# Patient Record
Sex: Male | Born: 1965 | Race: Black or African American | Hispanic: No | Marital: Married | Smoking: Never smoker
Health system: Southern US, Community
[De-identification: ages and names within clinical notes are randomized; demographics above are authoritative.]

---

## 2013-04-18 ENCOUNTER — Ambulatory Visit
Admission: RE | Admit: 2013-04-18 | Discharge: 2013-04-18 | Disposition: A | Discharge status: Home / Self Care | Payer: Federal, State, Local not specified - PPO | Source: Ambulatory Visit | Attending: Family Medicine | Admitting: Family Medicine

## 2013-04-18 ENCOUNTER — Other Ambulatory Visit: Payer: Self-pay | Admitting: Family Medicine

## 2013-04-18 DIAGNOSIS — M7502 Adhesive capsulitis of left shoulder: Secondary | ICD-10-CM

## 2015-10-18 DIAGNOSIS — J209 Acute bronchitis, unspecified: Secondary | ICD-10-CM | POA: Diagnosis not present

## 2016-07-22 DIAGNOSIS — L918 Other hypertrophic disorders of the skin: Secondary | ICD-10-CM | POA: Diagnosis not present

## 2017-05-26 DIAGNOSIS — Z0000 Encounter for general adult medical examination without abnormal findings: Secondary | ICD-10-CM | POA: Diagnosis not present

## 2017-05-26 DIAGNOSIS — Z8042 Family history of malignant neoplasm of prostate: Secondary | ICD-10-CM | POA: Diagnosis not present

## 2017-05-26 DIAGNOSIS — E7800 Pure hypercholesterolemia, unspecified: Secondary | ICD-10-CM | POA: Diagnosis not present

## 2017-08-16 ENCOUNTER — Other Ambulatory Visit: Payer: Self-pay

## 2017-08-16 DIAGNOSIS — R519 Headache, unspecified: Secondary | ICD-10-CM

## 2017-08-16 DIAGNOSIS — R51 Headache: Secondary | ICD-10-CM

## 2017-08-16 DIAGNOSIS — Z8249 Family history of ischemic heart disease and other diseases of the circulatory system: Secondary | ICD-10-CM

## 2017-08-22 DIAGNOSIS — R0789 Other chest pain: Secondary | ICD-10-CM | POA: Diagnosis not present

## 2017-08-22 DIAGNOSIS — L309 Dermatitis, unspecified: Secondary | ICD-10-CM | POA: Diagnosis not present

## 2017-08-25 DIAGNOSIS — B029 Zoster without complications: Secondary | ICD-10-CM | POA: Diagnosis not present

## 2017-09-01 ENCOUNTER — Ambulatory Visit
Admission: RE | Admit: 2017-09-01 | Discharge: 2017-09-01 | Disposition: A | Discharge status: Home / Self Care | Payer: Federal, State, Local not specified - PPO | Source: Ambulatory Visit | Attending: Family Medicine | Admitting: Family Medicine

## 2017-09-01 DIAGNOSIS — Z8249 Family history of ischemic heart disease and other diseases of the circulatory system: Secondary | ICD-10-CM | POA: Diagnosis not present

## 2017-09-01 DIAGNOSIS — R51 Headache: Principal | ICD-10-CM

## 2017-09-01 DIAGNOSIS — R519 Headache, unspecified: Secondary | ICD-10-CM

## 2017-09-01 MED ORDER — GADOBENATE DIMEGLUMINE 529 MG/ML IV SOLN
20.0000 mL | Freq: Once | INTRAVENOUS | Status: AC | PRN
  Administered 2017-09-01: 20 mL via INTRAVENOUS

## 2017-09-01 MED ORDER — IOPAMIDOL (ISOVUE-370) INJECTION 76%
75.0000 mL | Freq: Once | INTRAVENOUS | Status: AC | PRN
  Administered 2017-09-01: 75 mL via INTRAVENOUS

## 2017-11-11 DIAGNOSIS — T63481A Toxic effect of venom of other arthropod, accidental (unintentional), initial encounter: Secondary | ICD-10-CM | POA: Diagnosis not present

## 2018-02-09 DIAGNOSIS — Z0184 Encounter for antibody response examination: Secondary | ICD-10-CM | POA: Diagnosis not present

## 2018-09-03 ENCOUNTER — Other Ambulatory Visit: Payer: Self-pay | Admitting: Family Medicine

## 2018-09-03 DIAGNOSIS — I7779 Dissection of other artery: Secondary | ICD-10-CM

## 2018-09-20 ENCOUNTER — Other Ambulatory Visit: Payer: Federal, State, Local not specified - PPO

## 2018-11-12 ENCOUNTER — Other Ambulatory Visit: Payer: Self-pay

## 2018-11-12 ENCOUNTER — Ambulatory Visit
Admission: RE | Admit: 2018-11-12 | Discharge: 2018-11-12 | Disposition: A | Discharge status: Home / Self Care | Payer: Federal, State, Local not specified - PPO | Source: Ambulatory Visit | Attending: Family Medicine | Admitting: Family Medicine

## 2018-11-12 DIAGNOSIS — I7779 Dissection of other artery: Secondary | ICD-10-CM | POA: Diagnosis not present

## 2018-11-12 MED ORDER — IOPAMIDOL (ISOVUE-370) INJECTION 76%
100.0000 mL | Freq: Once | INTRAVENOUS | Status: AC | PRN
  Administered 2018-11-12: 100 mL via INTRAVENOUS

## 2018-12-03 DIAGNOSIS — Z20828 Contact with and (suspected) exposure to other viral communicable diseases: Secondary | ICD-10-CM | POA: Diagnosis not present

## 2019-03-01 DIAGNOSIS — Z8042 Family history of malignant neoplasm of prostate: Secondary | ICD-10-CM | POA: Diagnosis not present

## 2019-03-01 DIAGNOSIS — Z23 Encounter for immunization: Secondary | ICD-10-CM | POA: Diagnosis not present

## 2019-03-01 DIAGNOSIS — E7800 Pure hypercholesterolemia, unspecified: Secondary | ICD-10-CM | POA: Diagnosis not present

## 2019-03-01 DIAGNOSIS — Z0000 Encounter for general adult medical examination without abnormal findings: Secondary | ICD-10-CM | POA: Diagnosis not present

## 2019-03-05 DIAGNOSIS — Z1211 Encounter for screening for malignant neoplasm of colon: Secondary | ICD-10-CM | POA: Diagnosis not present

## 2019-08-27 IMAGING — CT CT ANGIOGRAPHY ABDOMEN AND PELVIS WITH CONTRAST AND WITHOUT CONT
2 of 4 series · 12 of 32 positions shown, 17 images · IV contrast (APPLIED)
Comparison: CT chest 09/01/2017

CLINICAL DATA: 52-year-old male with a history of prior celiac
artery findings

EXAM:
CTA ABDOMEN AND PELVIS WITH CONTRAST
TECHNIQUE: Multidetector CT imaging of the abdomen and pelvis was performed
using the standard protocol during bolus administration of
intravenous contrast. Multiplanar reconstructed images and MIPs were
obtained and reviewed to evaluate the vascular anatomy.
CONTRAST:  100mL [id number]-YYN IOPAMIDOL (MQ3FES-YYN) INJECTION 76%

[Series 4: mesenteric angio · axial · 0.73mm/px · z∈[+852,+1101]mm · 5 of 167 slices shown]
[im 12/167  soft-tissue]
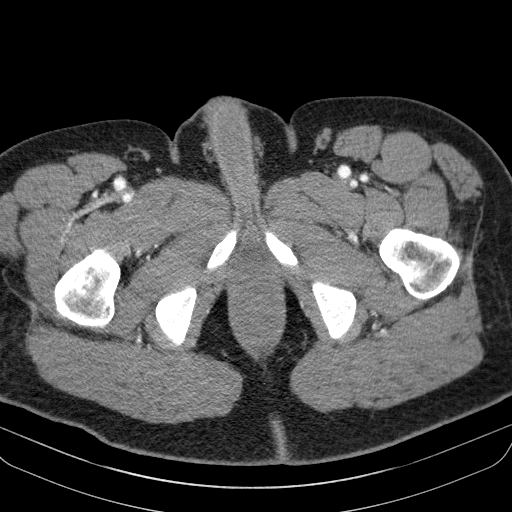
[im 36/167  soft-tissue]
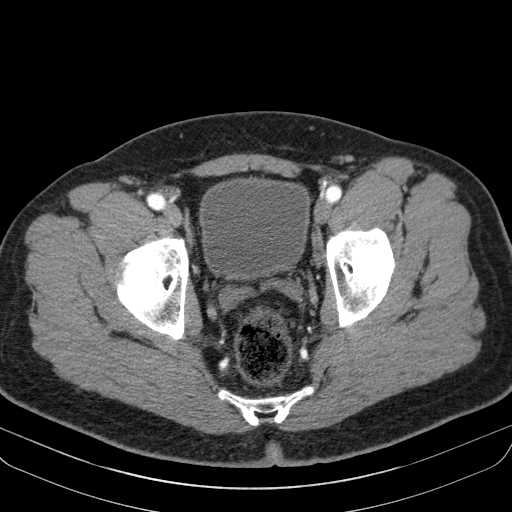
[im 60/167  soft-tissue]
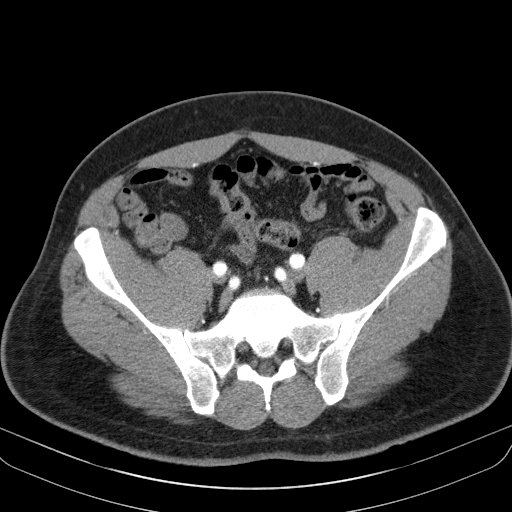
[im 72/167  soft-tissue]
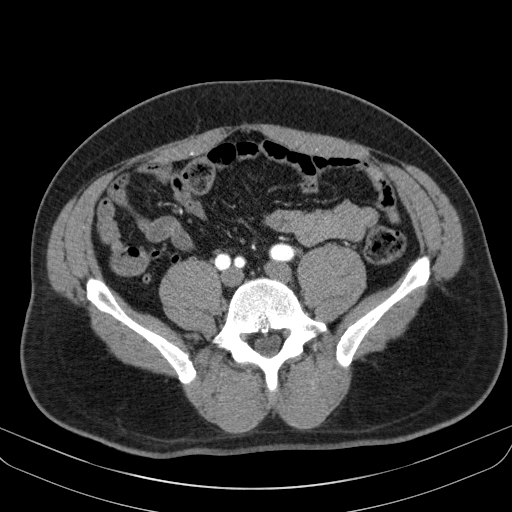
[im 95/167  soft-tissue]
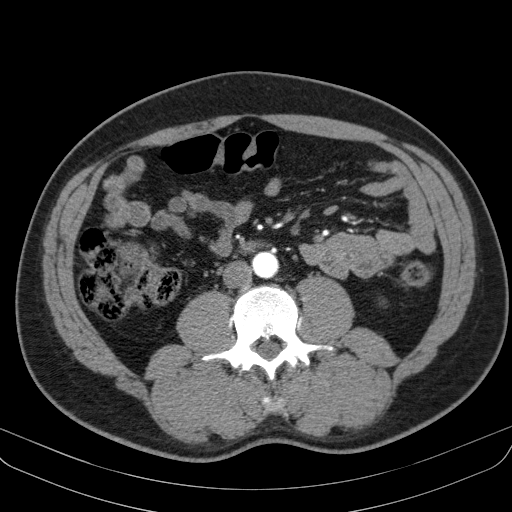

[Series 5: venous · axial · portal-venous · 0.73mm/px · z∈[+882,+1252]mm · 7 of 100 slices shown, 12 images]
[im 13/100  soft-tissue]
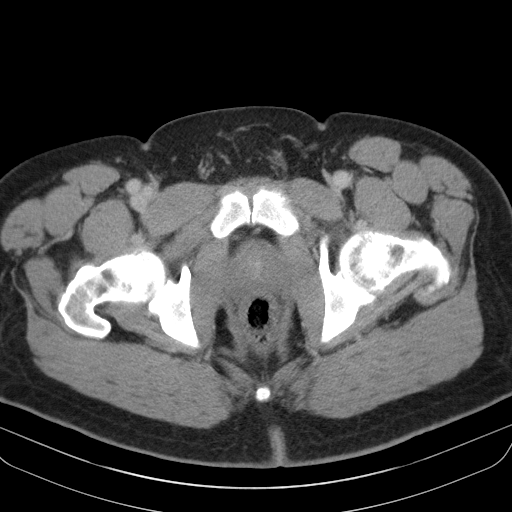
[im 13/100  bone]
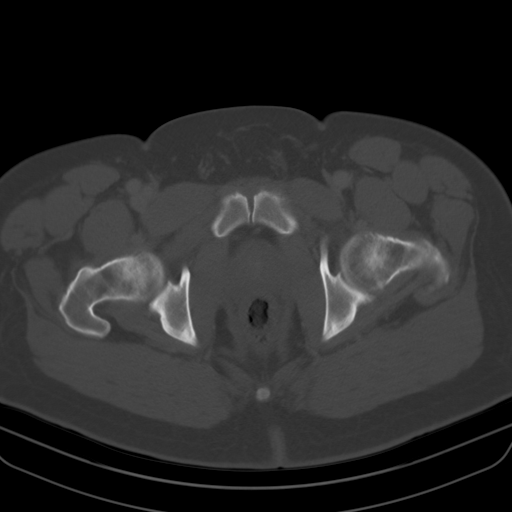
[im 25/100  soft-tissue]
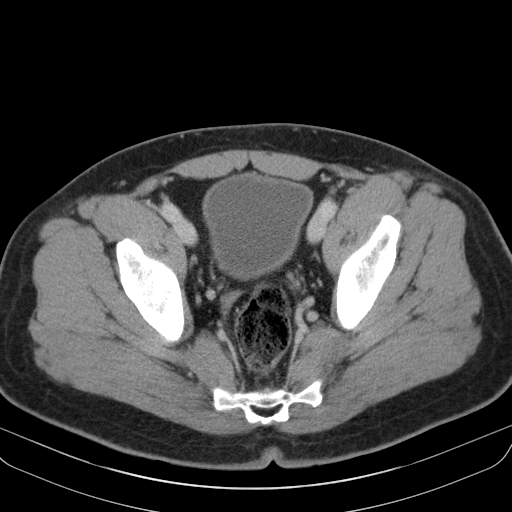
[im 38/100  soft-tissue]
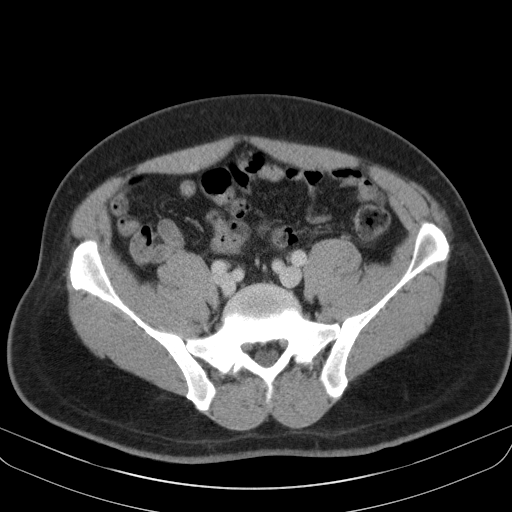
[im 50/100  soft-tissue]
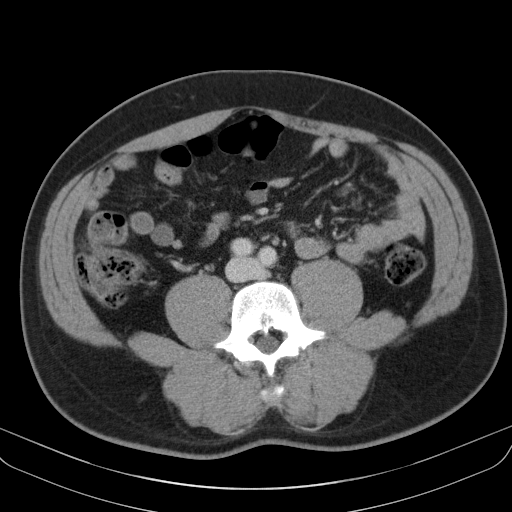
[im 50/100  lung]
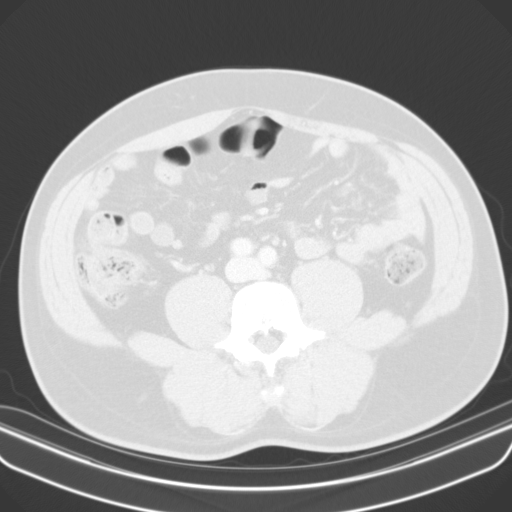
[im 62/100  soft-tissue]
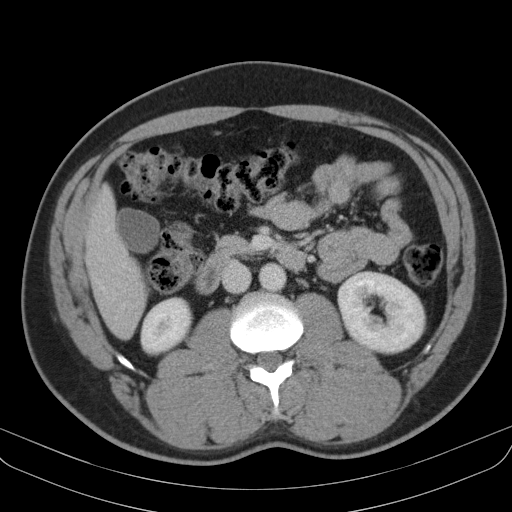
[im 62/100  lung]
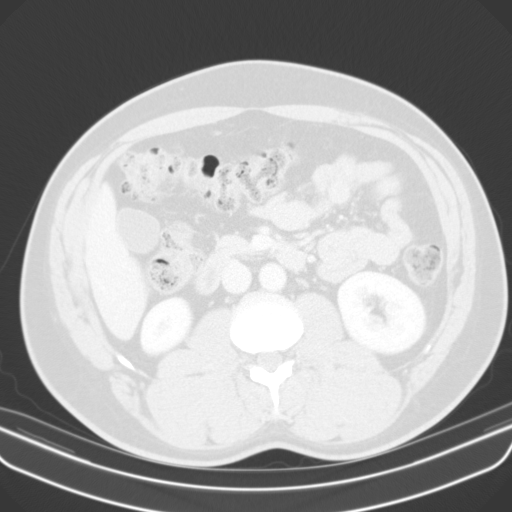
[im 75/100  soft-tissue]
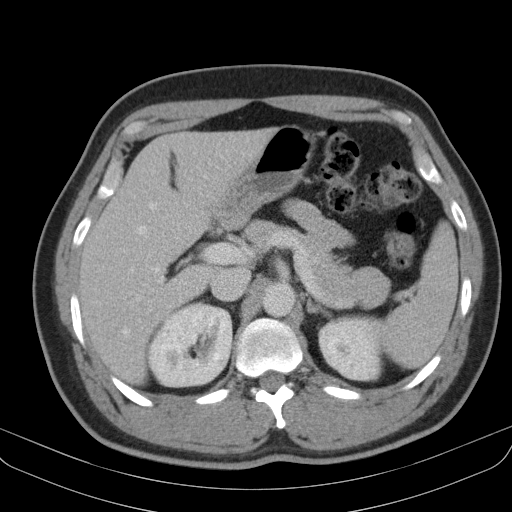
[im 75/100  lung]
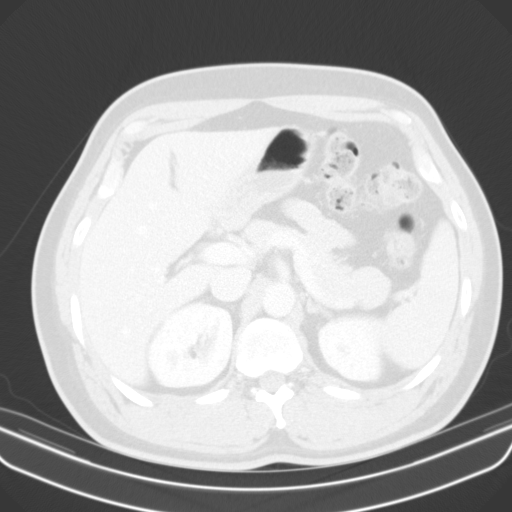
[im 87/100  soft-tissue]
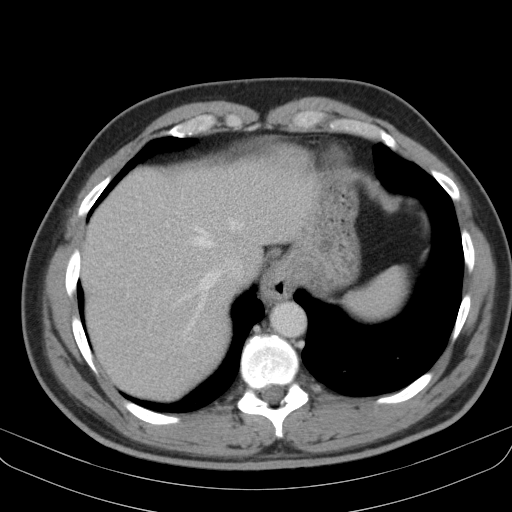
[im 87/100  lung]
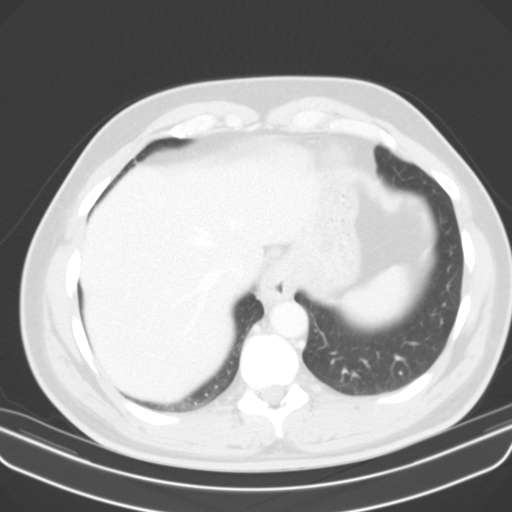

[12 of 32 positions shown; findings below may reference images not displayed]

FINDINGS: VASCULAR

Aorta: Unremarkable course, caliber, contour of the abdominal aorta.
No dissection, aneurysm, or periaortic fluid.

Celiac: On the coronal reformatted images, there is a short segment
linear filling defect extending across the celiac artery from the 11
o'clock position to the 4 o'clock position. Configuration is similar
to the comparison. Diameter of the celiac artery measures 12 mm.
Typical branch configuration with the celiac artery contributing to
splenic artery, common hepatic artery, and left gastric artery. No
inflammatory changes or focal wall thickening. No calcifications.

SMA: Unremarkable SMA which is patent.

Renals: Single right renal artery with no atherosclerosis.
Unremarkable configuration of the right renal artery.

Main left renal artery originates just below the right renal artery.
Unremarkable configuration with no atherosclerosis. Accessory left
renal artery from the 3 o'clock position, just inferior to the main
renal artery.

IMA: IMA is patent

Right lower extremity:

Unremarkable course, caliber, and contour of the right iliac system.
No aneurysm, dissection, or occlusion. Hypogastric artery is patent.
Anterior and posterior division patent. Common femoral artery
patent. Proximal SFA and profunda femoris patent.

Left lower extremity:

Unremarkable course, caliber, and contour of the left iliac system.
No aneurysm, dissection, or occlusion. Hypogastric artery is patent.
Anterior and posterior division patent. Common femoral artery
patent. Proximal SFA and profunda femoris patent.

Veins: Unremarkable appearance of the venous system.

Review of the MIP images confirms the above findings.

NON-VASCULAR

Lower chest: No acute.

Hepatobiliary: Unremarkable appearance of the liver. Unremarkable
gall bladder.

Pancreas: Unremarkable appearance of the pancreas. No
pericholecystic fluid or inflammatory changes. Unremarkable ductal
system.

Spleen: Unremarkable.

Adrenals/Urinary Tract: Unremarkable appearance of adrenal glands.

Right:

No hydronephrosis. Symmetric perfusion to the left. No
nephrolithiasis. Unremarkable course of the right ureter.

Left:

No hydronephrosis. Symmetric perfusion to the right. No
nephrolithiasis. Unremarkable course of the left ureter.

Unremarkable appearance of the urinary bladder .

Stomach/Bowel: Unremarkable appearance of the stomach. Hiatal
hernia. Unremarkable appearance of small bowel. No evidence of
obstruction. No colonic diverticula. Normal appendix.

Lymphatic: Multiple lymph nodes in the para-aortic nodal station,
none of which are enlarged.

Mesenteric: No free fluid or air. No adenopathy.

Reproductive: Unremarkable appearance of the pelvic organs.

Other: No hernia.

Musculoskeletal: No evidence of acute fracture. No bony canal
narrowing. No significant degenerative changes of the hips.
IMPRESSION: Unchanged appearance of short-segment non flow limiting chronic
dissection of the celiac artery.

Hiatal hernia.

## 2020-02-13 DIAGNOSIS — Z20822 Contact with and (suspected) exposure to covid-19: Secondary | ICD-10-CM | POA: Diagnosis not present

## 2020-02-13 DIAGNOSIS — Z03818 Encounter for observation for suspected exposure to other biological agents ruled out: Secondary | ICD-10-CM | POA: Diagnosis not present

## 2020-03-04 DIAGNOSIS — E7800 Pure hypercholesterolemia, unspecified: Secondary | ICD-10-CM | POA: Diagnosis not present

## 2020-03-04 DIAGNOSIS — Z8042 Family history of malignant neoplasm of prostate: Secondary | ICD-10-CM | POA: Diagnosis not present

## 2020-03-04 DIAGNOSIS — R030 Elevated blood-pressure reading, without diagnosis of hypertension: Secondary | ICD-10-CM | POA: Diagnosis not present

## 2020-03-04 DIAGNOSIS — Z0000 Encounter for general adult medical examination without abnormal findings: Secondary | ICD-10-CM | POA: Diagnosis not present

## 2020-03-04 DIAGNOSIS — R590 Localized enlarged lymph nodes: Secondary | ICD-10-CM | POA: Diagnosis not present

## 2020-07-03 DIAGNOSIS — Z1159 Encounter for screening for other viral diseases: Secondary | ICD-10-CM | POA: Diagnosis not present

## 2020-07-08 DIAGNOSIS — Z1211 Encounter for screening for malignant neoplasm of colon: Secondary | ICD-10-CM | POA: Diagnosis not present

## 2021-04-09 DIAGNOSIS — E7800 Pure hypercholesterolemia, unspecified: Secondary | ICD-10-CM | POA: Diagnosis not present

## 2021-04-09 DIAGNOSIS — Z0000 Encounter for general adult medical examination without abnormal findings: Secondary | ICD-10-CM | POA: Diagnosis not present

## 2021-04-09 DIAGNOSIS — Z8042 Family history of malignant neoplasm of prostate: Secondary | ICD-10-CM | POA: Diagnosis not present

## 2021-04-09 DIAGNOSIS — Z23 Encounter for immunization: Secondary | ICD-10-CM | POA: Diagnosis not present

## 2021-07-09 DIAGNOSIS — Z23 Encounter for immunization: Secondary | ICD-10-CM | POA: Diagnosis not present

## 2021-08-28 ENCOUNTER — Encounter (HOSPITAL_COMMUNITY): Payer: Self-pay | Admitting: Emergency Medicine

## 2021-08-28 ENCOUNTER — Other Ambulatory Visit: Payer: Self-pay

## 2021-08-28 ENCOUNTER — Ambulatory Visit (HOSPITAL_COMMUNITY)
Admission: EM | Admit: 2021-08-28 | Discharge: 2021-08-28 | Disposition: A | Discharge status: Home / Self Care | Payer: Federal, State, Local not specified - PPO | Attending: Urgent Care | Admitting: Urgent Care

## 2021-08-28 DIAGNOSIS — G44209 Tension-type headache, unspecified, not intractable: Secondary | ICD-10-CM

## 2021-08-28 MED ORDER — NAPROXEN 375 MG PO TABS: 375.0000 mg | ORAL_TABLET | Freq: Two times a day (BID) | ORAL | 0 refills | Status: AC

## 2021-08-28 MED ORDER — TIZANIDINE HCL 4 MG PO TABS: 4.0000 mg | ORAL_TABLET | Freq: Every day | ORAL | 0 refills | Status: AC
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
## 2022-01-31 DIAGNOSIS — T300 Burn of unspecified body region, unspecified degree: Secondary | ICD-10-CM | POA: Diagnosis not present

## 2022-04-11 DIAGNOSIS — Z0000 Encounter for general adult medical examination without abnormal findings: Secondary | ICD-10-CM | POA: Diagnosis not present

## 2022-04-11 DIAGNOSIS — R030 Elevated blood-pressure reading, without diagnosis of hypertension: Secondary | ICD-10-CM | POA: Diagnosis not present

## 2022-04-11 DIAGNOSIS — E7800 Pure hypercholesterolemia, unspecified: Secondary | ICD-10-CM | POA: Diagnosis not present

## 2022-04-11 DIAGNOSIS — Z8042 Family history of malignant neoplasm of prostate: Secondary | ICD-10-CM | POA: Diagnosis not present

## 2023-04-18 DIAGNOSIS — Z1159 Encounter for screening for other viral diseases: Secondary | ICD-10-CM | POA: Diagnosis not present

## 2023-04-18 DIAGNOSIS — Z0000 Encounter for general adult medical examination without abnormal findings: Secondary | ICD-10-CM | POA: Diagnosis not present

## 2023-04-18 DIAGNOSIS — Z130 Encounter for screening for diseases of the blood and blood-forming organs and certain disorders involving the immune mechanism: Secondary | ICD-10-CM | POA: Diagnosis not present

## 2023-04-18 DIAGNOSIS — E7800 Pure hypercholesterolemia, unspecified: Secondary | ICD-10-CM | POA: Diagnosis not present

## 2023-04-18 DIAGNOSIS — I10 Essential (primary) hypertension: Secondary | ICD-10-CM | POA: Diagnosis not present

## 2023-04-18 DIAGNOSIS — Z8042 Family history of malignant neoplasm of prostate: Secondary | ICD-10-CM | POA: Diagnosis not present

## 2024-02-08 DIAGNOSIS — M65342 Trigger finger, left ring finger: Secondary | ICD-10-CM | POA: Diagnosis not present

## 2024-04-18 ENCOUNTER — Other Ambulatory Visit: Payer: Self-pay | Admitting: Family Medicine

## 2024-04-18 DIAGNOSIS — Z0189 Encounter for other specified special examinations: Secondary | ICD-10-CM | POA: Diagnosis not present

## 2024-04-18 DIAGNOSIS — Z8042 Family history of malignant neoplasm of prostate: Secondary | ICD-10-CM | POA: Diagnosis not present

## 2024-04-18 DIAGNOSIS — M65342 Trigger finger, left ring finger: Secondary | ICD-10-CM | POA: Diagnosis not present

## 2024-04-18 DIAGNOSIS — Z130 Encounter for screening for diseases of the blood and blood-forming organs and certain disorders involving the immune mechanism: Secondary | ICD-10-CM | POA: Diagnosis not present

## 2024-04-18 DIAGNOSIS — I10 Essential (primary) hypertension: Secondary | ICD-10-CM | POA: Diagnosis not present

## 2024-04-18 DIAGNOSIS — E7800 Pure hypercholesterolemia, unspecified: Secondary | ICD-10-CM | POA: Diagnosis not present

## 2024-04-18 DIAGNOSIS — I7779 Dissection of other artery: Secondary | ICD-10-CM

## 2024-04-25 ENCOUNTER — Inpatient Hospital Stay: Admission: RE | Admit: 2024-04-25 | Source: Ambulatory Visit

## 2024-05-03 DIAGNOSIS — M65342 Trigger finger, left ring finger: Secondary | ICD-10-CM | POA: Diagnosis not present

## 2024-05-29 DIAGNOSIS — M65342 Trigger finger, left ring finger: Secondary | ICD-10-CM | POA: Diagnosis not present

## 2024-06-03 DIAGNOSIS — D1721 Benign lipomatous neoplasm of skin and subcutaneous tissue of right arm: Secondary | ICD-10-CM | POA: Diagnosis not present

## 2024-06-03 DIAGNOSIS — M7711 Lateral epicondylitis, right elbow: Secondary | ICD-10-CM | POA: Diagnosis not present
